# Patient Record
Sex: Male | Born: 1985 | Race: White | Hispanic: No | Marital: Married
Health system: Southern US, Community
[De-identification: ages and names within clinical notes are randomized; demographics above are authoritative.]

---

## 2020-02-19 ENCOUNTER — Emergency Department (HOSPITAL_BASED_OUTPATIENT_CLINIC_OR_DEPARTMENT_OTHER): Payer: BC Managed Care – PPO

## 2020-02-19 ENCOUNTER — Emergency Department (HOSPITAL_BASED_OUTPATIENT_CLINIC_OR_DEPARTMENT_OTHER)
Admission: EM | Admit: 2020-02-19 | Discharge: 2020-02-19 | Disposition: A | Discharge status: Home / Self Care | Payer: BC Managed Care – PPO | Attending: Emergency Medicine | Admitting: Emergency Medicine

## 2020-02-19 ENCOUNTER — Encounter (HOSPITAL_BASED_OUTPATIENT_CLINIC_OR_DEPARTMENT_OTHER): Payer: Self-pay | Admitting: Student

## 2020-02-19 DIAGNOSIS — R059 Cough, unspecified: Secondary | ICD-10-CM | POA: Insufficient documentation

## 2020-02-19 DIAGNOSIS — R0781 Pleurodynia: Secondary | ICD-10-CM | POA: Diagnosis present

## 2020-02-19 DIAGNOSIS — R791 Abnormal coagulation profile: Secondary | ICD-10-CM | POA: Insufficient documentation

## 2020-02-19 DIAGNOSIS — R079 Chest pain, unspecified: Secondary | ICD-10-CM

## 2020-02-19 LAB — BASIC METABOLIC PANEL
Anion gap: 9 (ref 5–15)
BUN: 17 mg/dL (ref 6–20)
CO2: 24 mmol/L (ref 22–32)
Calcium: 9.2 mg/dL (ref 8.9–10.3)
Chloride: 102 mmol/L (ref 98–111)
Creatinine, Ser: 0.92 mg/dL (ref 0.61–1.24)
GFR, Estimated: >60 mL/min (ref >60)
Glucose, Bld: 130 mg/dL — ABNORMAL HIGH (ref 70–99)
Potassium: 3.6 mmol/L (ref 3.5–5.1)
Sodium: 135 mmol/L (ref 135–145)

## 2020-02-19 LAB — CBC
HCT: 45.4 % (ref 39.0–52.0)
Hemoglobin: 15.6 g/dL (ref 13.0–17.0)
MCH: 30.4 pg (ref 26.0–34.0)
MCHC: 34.4 g/dL (ref 30.0–36.0)
MCV: 88.3 fL (ref 80.0–100.0)
Platelets: 302 10*3/uL (ref 150–400)
RBC: 5.14 MIL/uL (ref 4.22–5.81)
RDW: 12.5 % (ref 11.5–15.5)
WBC: 7.7 10*3/uL (ref 4.0–10.5)
nRBC: 0 % (ref 0.0–0.2)

## 2020-02-19 LAB — TROPONIN I (HIGH SENSITIVITY)
Troponin I (High Sensitivity): 3 ng/L (ref <18)
Troponin I (High Sensitivity): 3 ng/L (ref <18)

## 2020-02-19 LAB — D-DIMER, QUANTITATIVE: D-Dimer, Quant: 0.53 ug/mL-FEU — ABNORMAL HIGH (ref 0.00–0.50)

## 2020-02-19 MED ORDER — LIDOCAINE 5 % EX PTCH: 1.0000 | MEDICATED_PATCH | Freq: Every day | CUTANEOUS | 0 refills | Status: AC | PRN

## 2020-02-19 MED ORDER — IOHEXOL 350 MG/ML SOLN
100.0000 mL | Freq: Once | INTRAVENOUS | Status: AC | PRN
  Administered 2020-02-19: 100 mL via INTRAVENOUS

## 2020-02-19 MED ORDER — ACETAMINOPHEN 500 MG PO TABS
1000.0000 mg | ORAL_TABLET | Freq: Once | ORAL | Status: AC
  Administered 2020-02-19: 1000 mg via ORAL
  Filled 2020-02-19: qty 2

## 2020-02-19 MED ORDER — NAPROXEN 500 MG PO TABS: 500.0000 mg | ORAL_TABLET | Freq: Two times a day (BID) | ORAL | 0 refills | Status: AC | PRN

## 2020-02-19 MED ORDER — LIDOCAINE 5 % EX PTCH
1.0000 | MEDICATED_PATCH | CUTANEOUS | Status: DC
  Administered 2020-02-19: 1 via TRANSDERMAL
  Filled 2020-02-19: qty 1

## 2020-02-19 MED ORDER — METHOCARBAMOL 500 MG PO TABS: 500.0000 mg | ORAL_TABLET | Freq: Three times a day (TID) | ORAL | 0 refills | Status: AC | PRN

## 2020-02-19 NOTE — ED Triage Notes (Signed)
Pt states he was ice skating on Saturday. Pt reports since then he has had stabbing pain in his chest that radiates into his back. Pt denies n/v

## 2020-02-19 NOTE — ED Provider Notes (Signed)
[facility] EMERGENCY DEPARTMENT Provider Note   CSN: [id number] Arrival date & time: 02/19/20  1001     History Chief Complaint  Patient presents with  . Chest Pain    Bryan Walker is a 34 y.o. male with a hx of tobacco abuse who presents to the ED with complaints of chest pain x 4 days. Patient states pain is to the right chest & back, it is constant, feels like a stabbing pain, specifically worse with breathing in pleuritic nature, no alleviating factors. Had minimal cough, no coughing up blood. Did have an episode vomiting a few days ago- none since. Recently drove here from [place]. Denies dyspnea, leg pain/swelling, hemoptysis, recent surgery/trauma,  hormone use, personal hx of cancer, or hx of DVT/PE. He was iceskating the morning prior to onset and had a fall but did not land on his chest at all.     HPI     No past medical history on file.  There are no problems to display for this patient.   History reviewed. No pertinent surgical history.     No family history on file.     Home Medications Prior to Admission medications   Not on File    Allergies    Patient has no known allergies.  Review of Systems   Review of Systems  Constitutional: Negative for chills and fever.  Respiratory: Positive for cough. Negative for shortness of breath.   Cardiovascular: Positive for chest pain. Negative for leg swelling.  Gastrointestinal: Positive for vomiting (x1). Negative for abdominal pain, blood in stool, constipation and diarrhea.  All other systems reviewed and are negative.   Physical Exam Updated Vital Signs BP 124/85 (BP Location: Right Arm)   Pulse 97   Temp 98.2 F (36.8 C) (Oral)   Resp 16   Ht 6' (1.829 m)   Wt 102.1 kg   SpO2 100%   BMI 30.52 kg/m   Physical Exam Vitals and nursing note reviewed.  Constitutional:      General: He is not in acute distress.    Appearance: He is well-developed. He is not toxic-appearing.  HENT:      Head: Normocephalic and atraumatic.  Eyes:     General:        Right eye: No discharge.        Left eye: No discharge.     Conjunctiva/sclera: Conjunctivae normal.  Cardiovascular:     Rate and Rhythm: Normal rate and regular rhythm.     Pulses:          Radial pulses are 2+ on the right side and 2+ on the left side.  Pulmonary:     Effort: Pulmonary effort is normal. No respiratory distress.     Breath sounds: Normal breath sounds. No wheezing, rhonchi or rales.  Chest:     Chest wall: Tenderness (mild anterior left chest wall) present.  Abdominal:     General: There is no distension.     Palpations: Abdomen is soft.     Tenderness: There is no abdominal tenderness.  Musculoskeletal:     Cervical back: Neck supple.     Right lower leg: No tenderness. No edema.     Left lower leg: No tenderness. No edema.  Skin:    General: Skin is warm and dry.     Findings: No rash.  Neurological:     Mental Status: He is alert.     Comments: Clear speech.   Psychiatric:  Behavior: Behavior normal.     ED Results / Procedures / Treatments   Labs (all labs ordered are listed, but only abnormal results are displayed) Labs Reviewed  BASIC METABOLIC PANEL - Abnormal; Notable for the following components:      Result Value   Glucose, Bld 130 (*)    All other components within normal limits  D-DIMER, QUANTITATIVE (NOT AT [facility]) - Abnormal; Notable for the following components:   D-Dimer, Quant 0.53 (*)    All other components within normal limits  CBC  TROPONIN I (HIGH SENSITIVITY)  TROPONIN I (HIGH SENSITIVITY)    EKG EKG Interpretation  Date/Time:  Tuesday February 19 2020 10:09:11 EST Ventricular Rate:  92 PR Interval:  138 QRS Duration: 88 QT Interval:  342 QTC Calculation: 422 R Axis:   120 Text Interpretation: Normal sinus rhythm Right axis deviation Abnormal ECG NSR, no STEMI Confirmed by Coralee Pesa [phone]) on 02/19/2020 12:54:09 PM   Radiology DG Chest  2 View  Result Date: 02/19/2020 CLINICAL DATA:  Chest pain. EXAM: CHEST - 2 VIEW COMPARISON:  No prior. FINDINGS: Mediastinum and hilar structures normal. Lungs are clear. No pleural effusion or pneumothorax. Heart size normal. No acute bony abnormality. IMPRESSION: No acute cardiopulmonary disease. Electronically Signed   By: Maisie Fus  Register   On: 02/19/2020 10:36    Procedures Procedures (including critical care time)  Medications Ordered in ED Medications  lidocaine (LIDODERM) 5 % 1 patch (1 patch Transdermal Patch Applied 02/19/20 1255)  acetaminophen (TYLENOL) tablet 1,000 mg (1,000 mg Oral Given 02/19/20 1256)    ED Course  I have reviewed the triage vital signs and the nursing notes.  Pertinent labs & imaging results that were available during my care of the patient were reviewed by me and considered in my medical decision making (see chart for details).    MDM Rules/Calculators/A&P                          Patient presents to the ED with complaints of chest pain for the past few days.  Patient is nontoxic, vitals without significant abnormality.   DDx: ACS, pulmonary embolism, dissection, pneumothorax, pneumonia, rib fracture, pleurisy, musculoskeletal  Additional history obtained:  Additional history obtained from nursing note review.   Lab Tests:  I Ordered, reviewed, and interpreted labs, which included:  CBC, BMP, troponins, D-dimer: D-dimer mildly elevated, otherwise unremarkable.  Imaging Studies ordered:  I ordered imaging studies which included CT angio of the chest, I independently visualized and interpreted imaging which showed no acute process, no evidence of pulmonary embolism, pneumothorax, pneumonia, or pneumothorax.  HEAR score low risk- EKG without obvious acute ischemia, delta troponin negative, doubt ACS.  CTA negative for PE.  Pain is not a tearing sensation, symmetric pulses, no widening of mediastinum on CXR, doubt dissection. Cardiac monitor  reviewed, no notable arrhythmias or tachycardia.  Unclear definitive etiology, favor musculoskeletal versus pleurisy, patient with pain improvement status post Lidoderm patch and Tylenol.  Will discharge home with supportive care. I discussed results, treatment plan, need for PCP follow-up, and return precautions with the patient. Provided opportunity for questions, patient confirmed understanding and is in agreement with plan.   Portions of this note were generated with Scientist, clinical (histocompatibility and immunogenetics). Dictation errors may occur despite best attempts at proofreading.  Final Clinical Impression(s) / ED Diagnoses Final diagnoses:  Chest pain, unspecified type    Rx / DC Orders ED Discharge Orders  Ordered    lidocaine (LIDODERM) 5 %  Daily PRN        02/19/20 1624    methocarbamol (ROBAXIN) 500 MG tablet  Every 8 hours PRN        02/19/20 1624    naproxen (NAPROSYN) 500 MG tablet  2 times daily PRN        02/19/20 1624           Christyanna Mckeon, [place] R, PA-C 02/19/20 1625    Rozelle Logan, DO 02/19/20 2033

## 2020-02-19 NOTE — Discharge Instructions (Signed)
You were seen in the emergency department today for chest pain. Your work-up in the emergency department has been overall reassuring. Your labs have been fairly normal and or similar to previous blood work you have had done. Your EKG and the enzyme we use to check your heart did not show an acute heart attack at this time. Your chest x-ray was normal.  Your CT scan did not show blood clot, pneumonia, or a collapsed lung. We are sending you with follow medicines help with your symptoms:  - Naproxen is a nonsteroidal anti-inflammatory medication that will help with pain and swelling. Be sure to take this medication as prescribed with food, 1 pill every 12 hours,  It should be taken with food, as it can cause stomach upset, and more seriously, stomach bleeding. Do not take other nonsteroidal anti-inflammatory medications with this such as Advil, Motrin, Aleve, Mobic, Goodie Powder, or Motrin.    - Robaxin is the muscle relaxer I have prescribed, this is meant to help with muscle tightness. Be aware that this medication may make you drowsy therefore the first time you take this it should be at a time you are in an environment where you can rest. Do not drive or operate heavy machinery when taking this medication. Do not drink alcohol or take other sedating medications with this medicine such as narcotics or benzodiazepines.   - Lidoderm patch-apply 1 patch to area of a significant pain once per day, remove patch within 12 hours of application.  You make take Tylenol per over the counter dosing with these medications.   We have prescribed you new medication(s) today. Discuss the medications prescribed today with your pharmacist as they can have adverse effects and interactions with your other medicines including over the counter and prescribed medications. Seek medical evaluation if you start to experience new or abnormal symptoms after taking one of these medicines, seek care immediately if you start to  experience difficulty breathing, feeling of your throat closing, facial swelling, or rash as these could be indications of a more serious allergic reaction   We would like you to follow up closely with your primary care provider within 3 days.  Return to the ER immediately should you experience any new or worsening symptoms including but not limited to return of pain, worsened pain, vomiting, shortness of breath, dizziness, lightheadedness, passing out, or any other concerns that you may have.

## 2020-02-19 NOTE — ED Notes (Signed)
Explained to Pt. That the CT has been ordered and that is what the wait is for.  Pt. Verbalized understanding.

## 2021-11-04 IMAGING — CT CT ANGIO CHEST
2 of 8 series · 19 of 36 positions shown · IV contrast (Omnipaque)
Comparison: Radiograph of same day.

CLINICAL DATA: Positive D-dimer.

EXAM:
CT ANGIOGRAPHY CHEST WITH CONTRAST
TECHNIQUE: Multidetector CT imaging of the chest was performed using the
standard protocol during bolus administration of intravenous
contrast. Multiplanar CT image reconstructions and MIPs were
obtained to evaluate the vascular anatomy.
CONTRAST:  100mL OMNIPAQUE IOHEXOL 350 MG/ML SOLN

[Series 6: pe coronal mpr · coronal · 0.68mm/px · 1 of 96 slices shown]
[im 48/96  mediastinal]
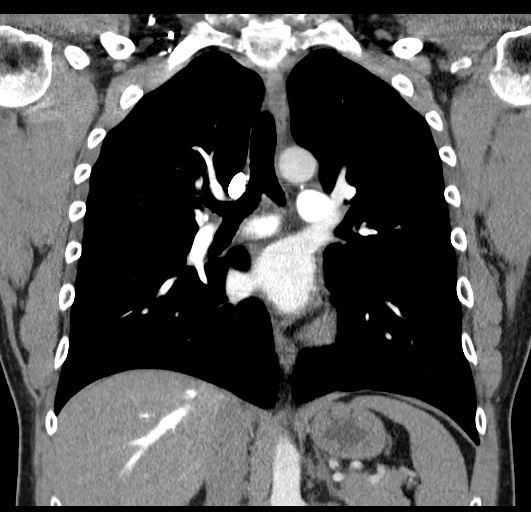

[Series 10: pe thins · axial · 0.75mm/px · z∈[-201,+94]mm · 18 of 437 slices shown]
[im 22/437  lung]
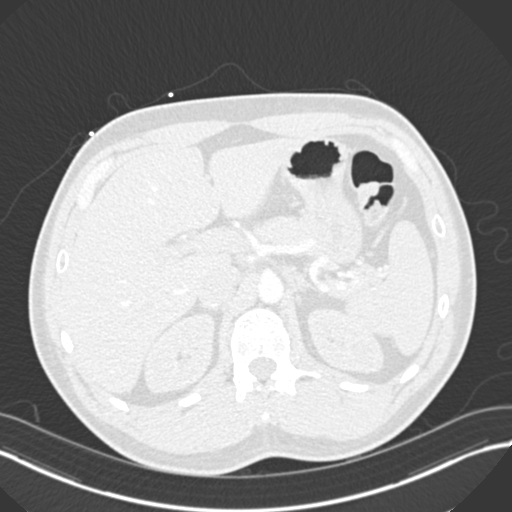
[im 44/437  mediastinal]
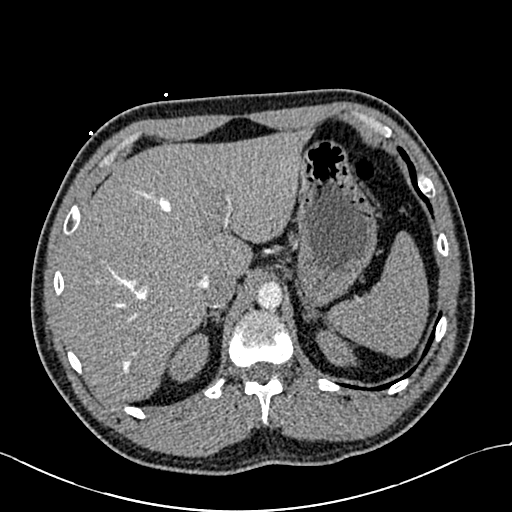
[im 66/437  lung]
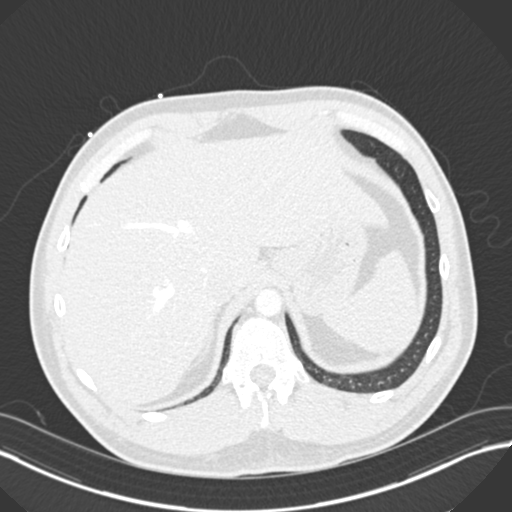
[im 88/437  mediastinal]
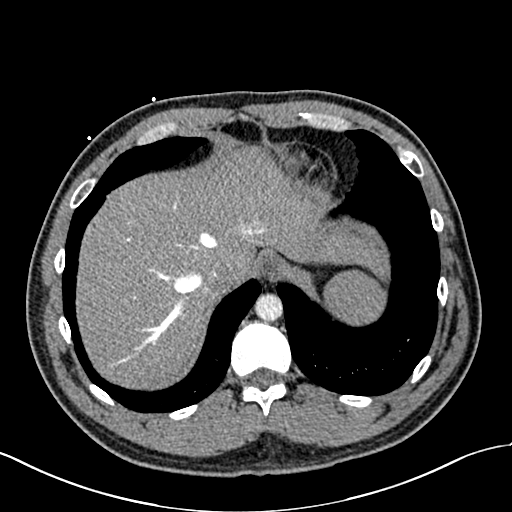
[im 110/437  lung]
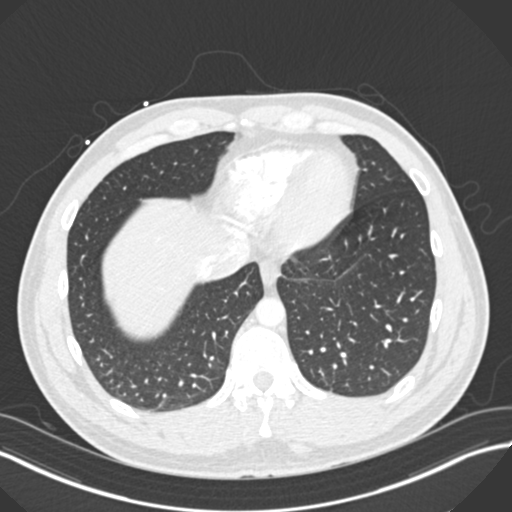
[im 131/437  mediastinal]
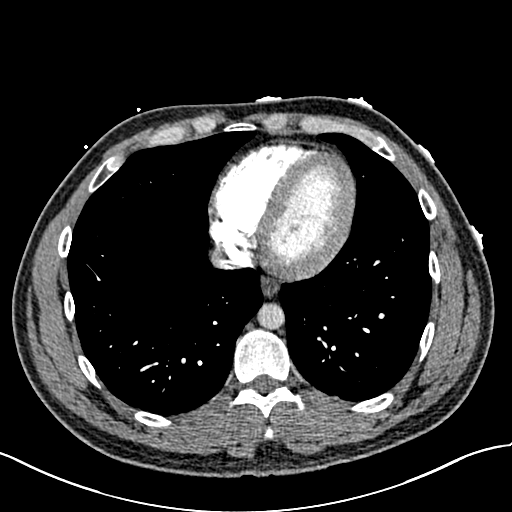
[im 153/437  lung]
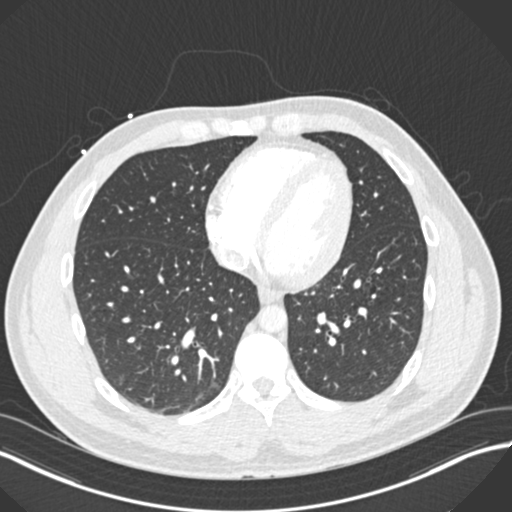
[im 175/437  mediastinal]
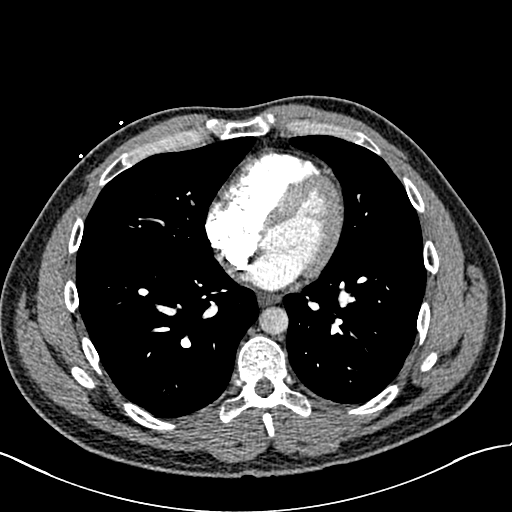
[im 197/437  lung]
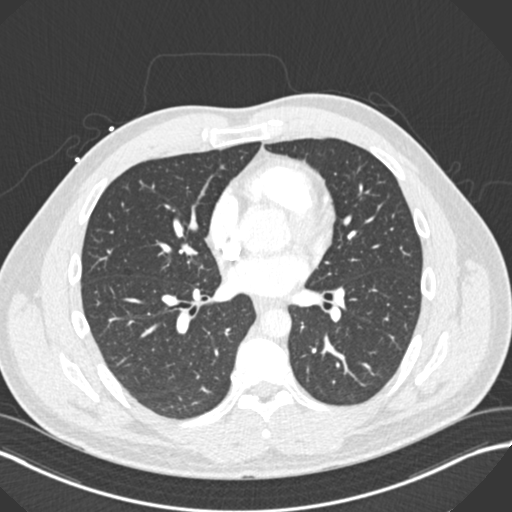
[im 240/437  mediastinal]
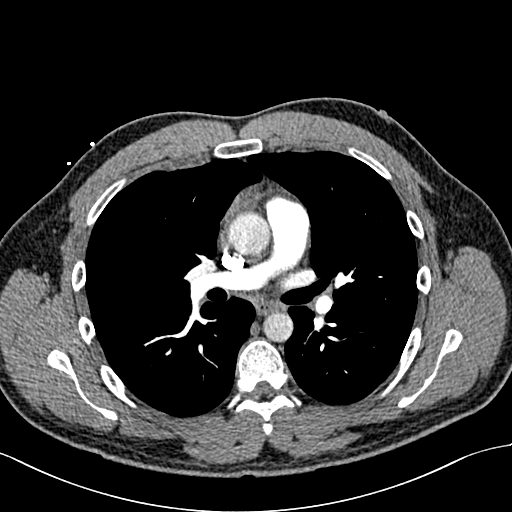
[im 262/437  lung]
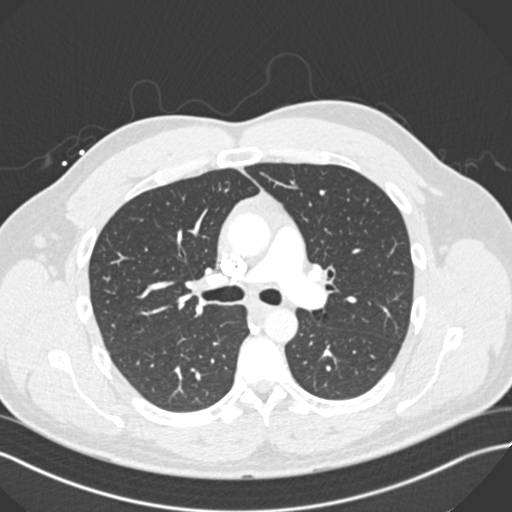
[im 284/437  mediastinal]
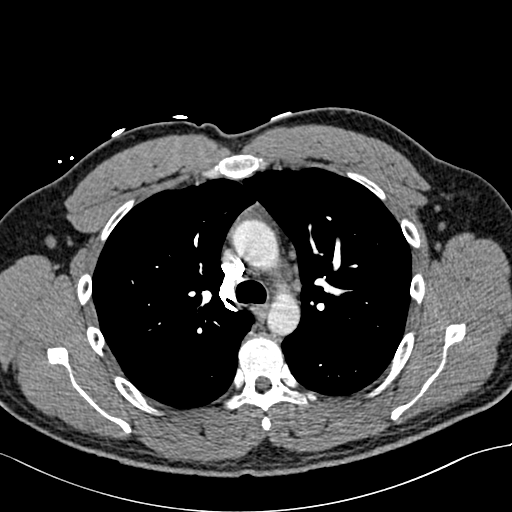
[im 306/437  lung]
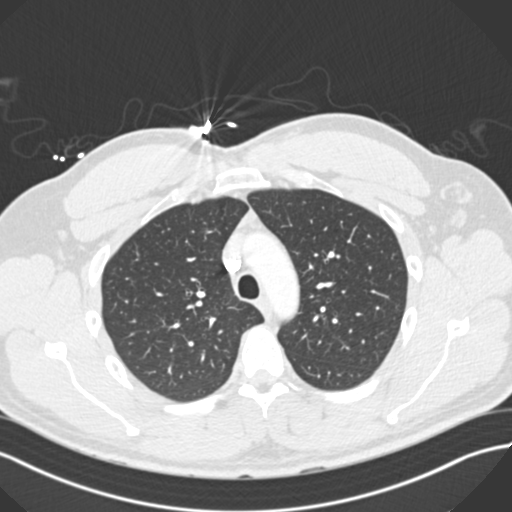
[im 328/437  mediastinal]
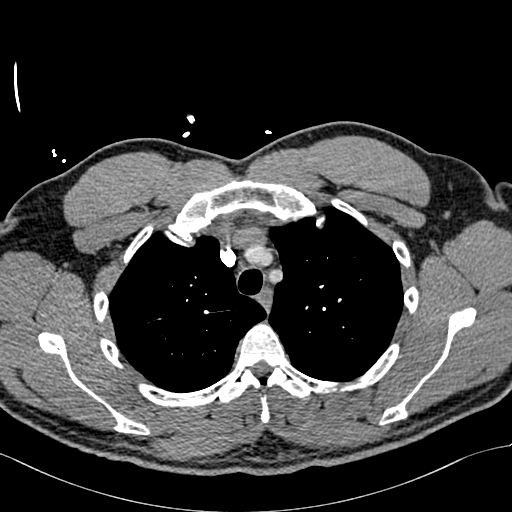
[im 349/437  lung]
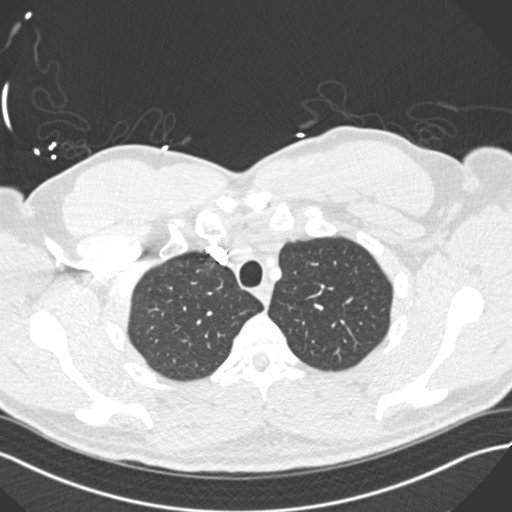
[im 371/437  mediastinal]
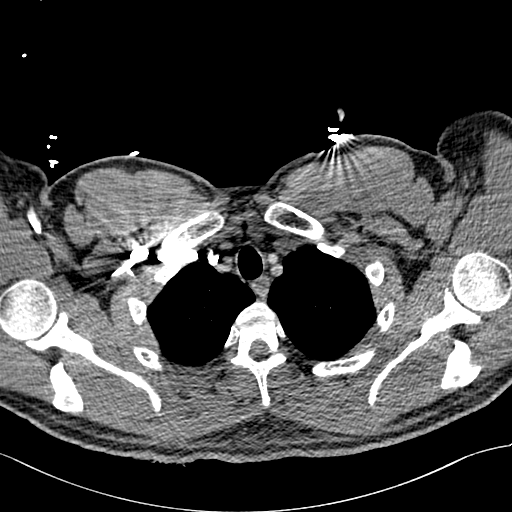
[im 393/437  lung]
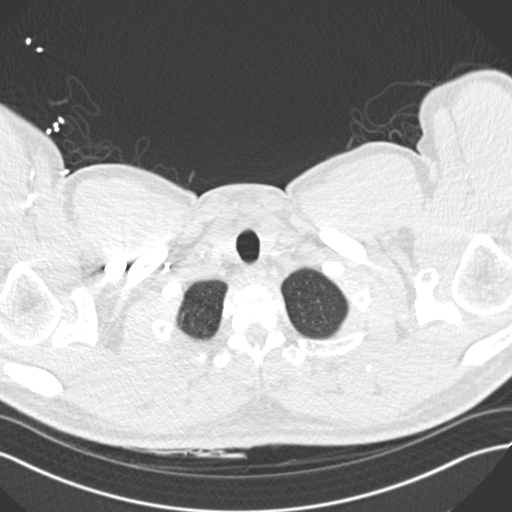
[im 415/437  mediastinal]
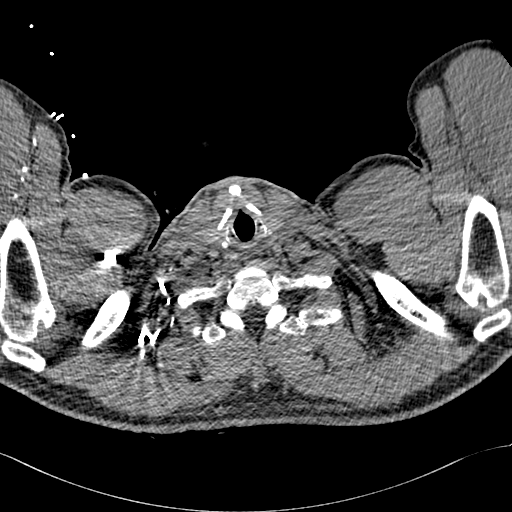

[19 of 36 positions shown; findings below may reference images not displayed]

FINDINGS: Cardiovascular: Satisfactory opacification of the pulmonary arteries
to the segmental level. No evidence of pulmonary embolism. Normal
heart size. No pericardial effusion.

Mediastinum/Nodes: No enlarged mediastinal, hilar, or axillary lymph
nodes. Thyroid gland, trachea, and esophagus demonstrate no
significant findings.

Lungs/Pleura: Lungs are clear. No pleural effusion or pneumothorax.

Upper Abdomen: No acute abnormality.

Musculoskeletal: No chest wall abnormality. No acute or significant
osseous findings.

Review of the MIP images confirms the above findings.
IMPRESSION: No definite evidence of pulmonary embolus. No acute cardiopulmonary
abnormality seen.

## 2021-11-04 IMAGING — CR DG CHEST 2V
2 series · 2 of 2 positions shown · non-contrast
Comparison: No prior.

CLINICAL DATA: Chest pain.

EXAM:
CHEST - 2 VIEW

[w chest pa]
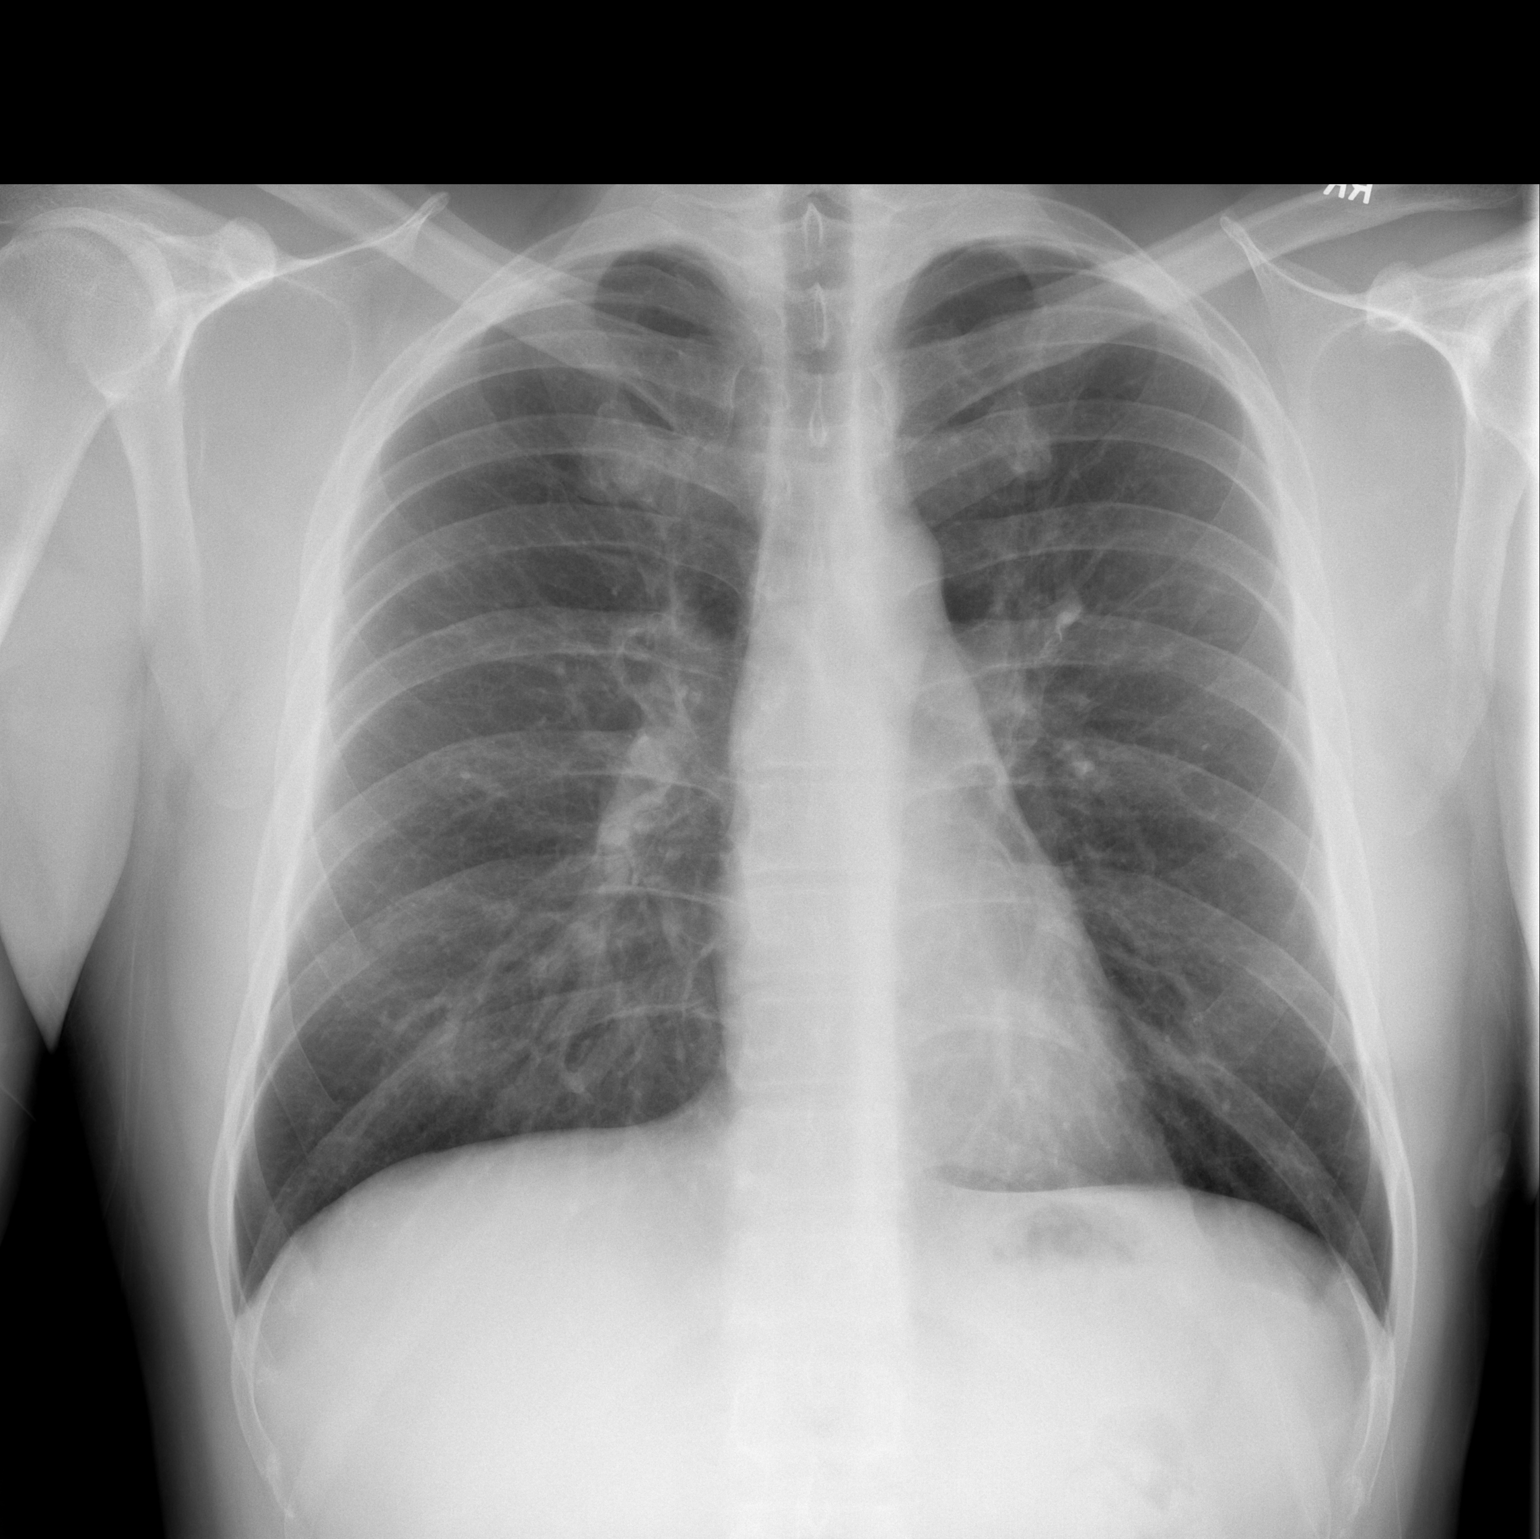

[w chest lat]
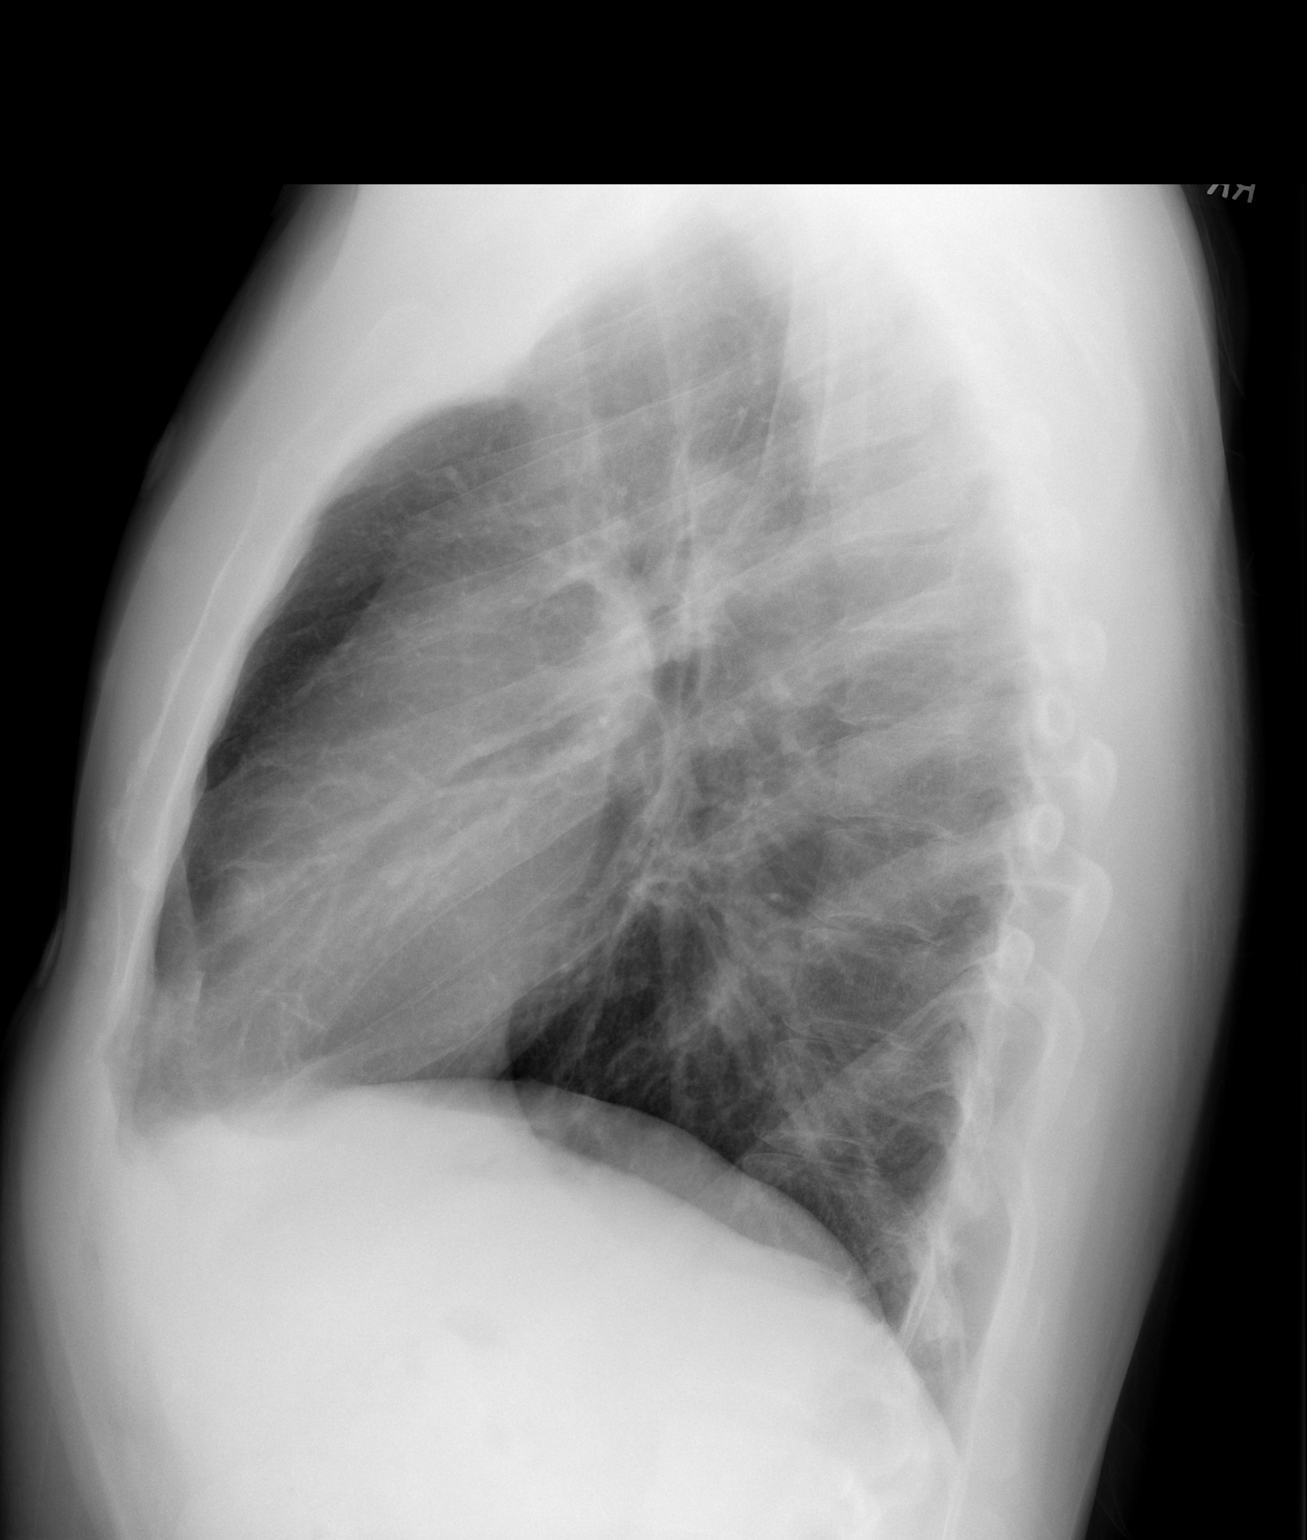

[2 of 2 positions shown; findings below may reference images not displayed]

FINDINGS: Mediastinum and hilar structures normal. Lungs are clear. No pleural
effusion or pneumothorax. Heart size normal. No acute bony
abnormality.
IMPRESSION: No acute cardiopulmonary disease.
